# Patient Record
Sex: Female | Born: 1980 | Race: White | Hispanic: No | Marital: Single | Smoking: Never smoker
Health system: Southern US, Community
[De-identification: ages and names within clinical notes are randomized; demographics above are authoritative.]

## PROBLEM LIST (undated history)

## (undated) DIAGNOSIS — F32A Depression, unspecified: Secondary | ICD-10-CM

---

## 2004-10-15 ENCOUNTER — Emergency Department (HOSPITAL_COMMUNITY): Admission: EM | Admit: 2004-10-15 | Discharge: 2004-10-15 | Payer: Self-pay | Admitting: Emergency Medicine

## 2005-06-02 ENCOUNTER — Ambulatory Visit (HOSPITAL_COMMUNITY): Admission: RE | Admit: 2005-06-02 | Discharge: 2005-06-02 | Payer: Self-pay | Admitting: *Deleted

## 2005-07-13 ENCOUNTER — Ambulatory Visit (HOSPITAL_COMMUNITY): Admission: RE | Admit: 2005-07-13 | Discharge: 2005-07-13 | Payer: Self-pay | Admitting: *Deleted

## 2005-12-04 ENCOUNTER — Ambulatory Visit: Payer: Self-pay | Admitting: Obstetrics and Gynecology

## 2005-12-04 ENCOUNTER — Inpatient Hospital Stay (HOSPITAL_COMMUNITY): Admission: AD | Admit: 2005-12-04 | Discharge: 2005-12-06 | Payer: Self-pay | Admitting: Family Medicine

## 2007-05-29 ENCOUNTER — Emergency Department (HOSPITAL_COMMUNITY): Admission: EM | Admit: 2007-05-29 | Discharge: 2007-05-29 | Payer: Self-pay | Admitting: Family Medicine

## 2007-06-21 ENCOUNTER — Ambulatory Visit: Payer: Self-pay | Admitting: Infectious Diseases

## 2007-06-21 DIAGNOSIS — B009 Herpesviral infection, unspecified: Secondary | ICD-10-CM | POA: Insufficient documentation

## 2007-09-16 ENCOUNTER — Emergency Department (HOSPITAL_COMMUNITY): Admission: EM | Admit: 2007-09-16 | Discharge: 2007-09-16 | Payer: Self-pay | Admitting: Family Medicine

## 2008-02-07 ENCOUNTER — Emergency Department (HOSPITAL_COMMUNITY): Admission: EM | Admit: 2008-02-07 | Discharge: 2008-02-07 | Payer: Self-pay

## 2008-06-29 ENCOUNTER — Emergency Department (HOSPITAL_COMMUNITY): Admission: EM | Admit: 2008-06-29 | Discharge: 2008-06-29 | Payer: Self-pay | Admitting: Emergency Medicine

## 2010-01-08 IMAGING — CR DG CERVICAL SPINE COMPLETE 4+V
5 series · 5 of 5 positions shown · non-contrast
Comparison: No prior studies

CLINICAL DATA: Neck pain

CERVICAL SPINE - COMPLETE 4+ VIEW

[w c-spine lat]
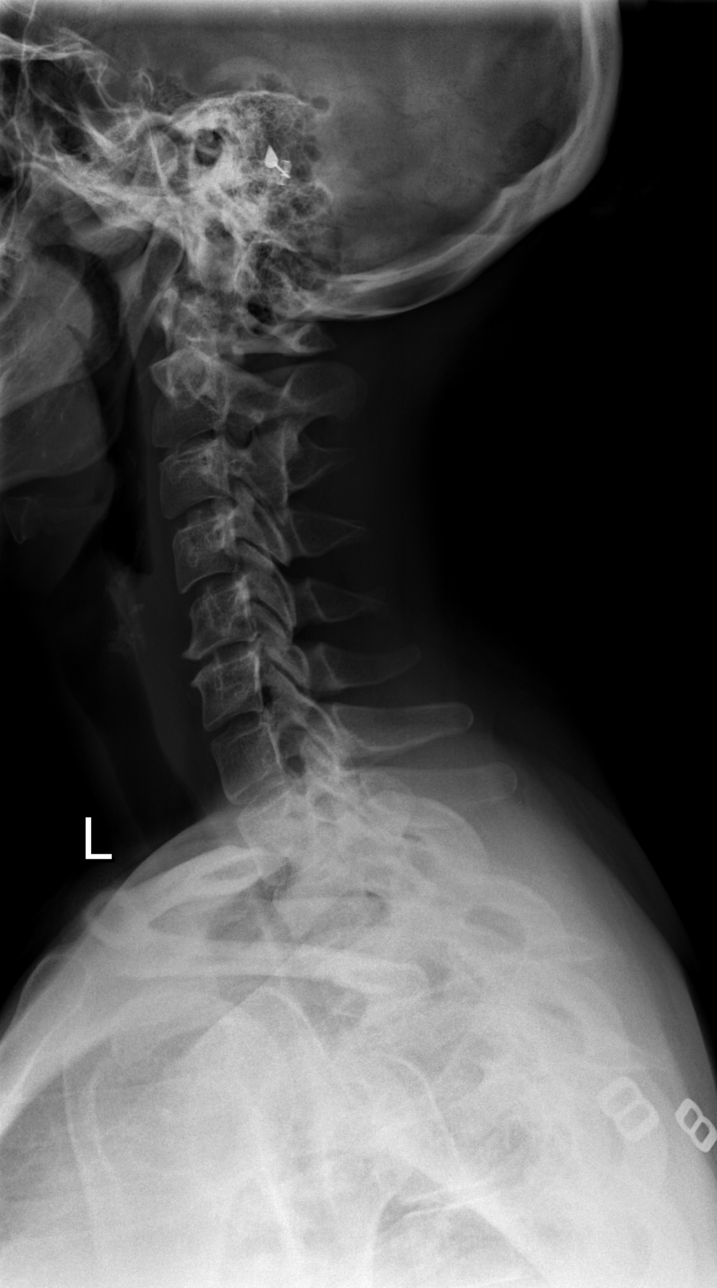

[w c-spine oblique (1 of 2)]
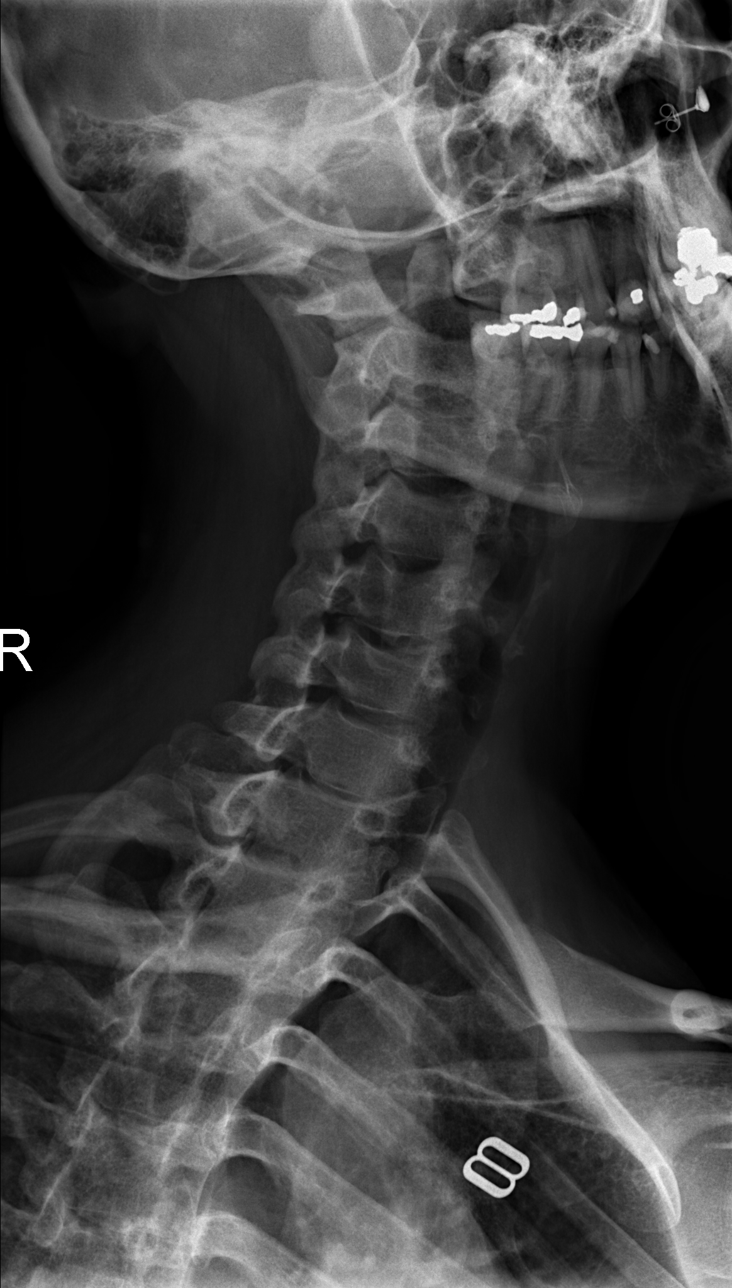

[w c-spine oblique (2 of 2)]
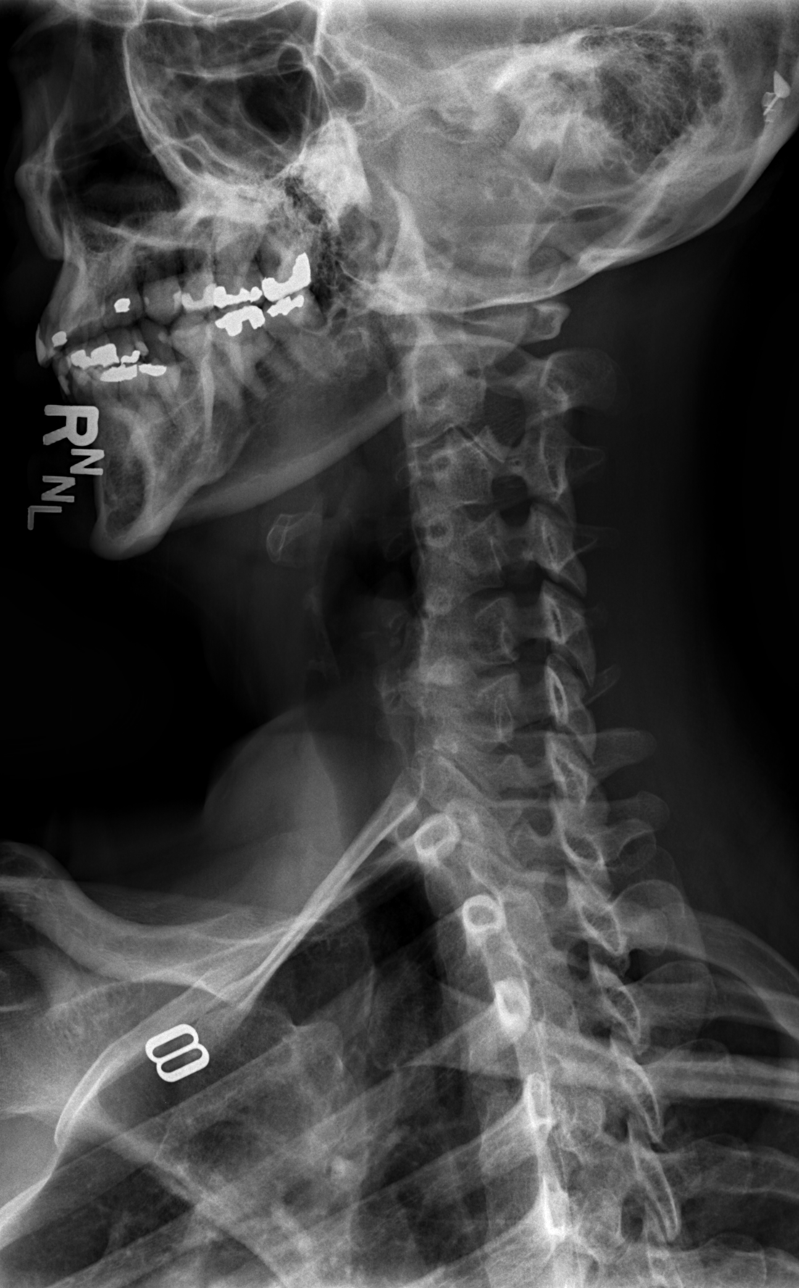

[w c-spine a.p.]
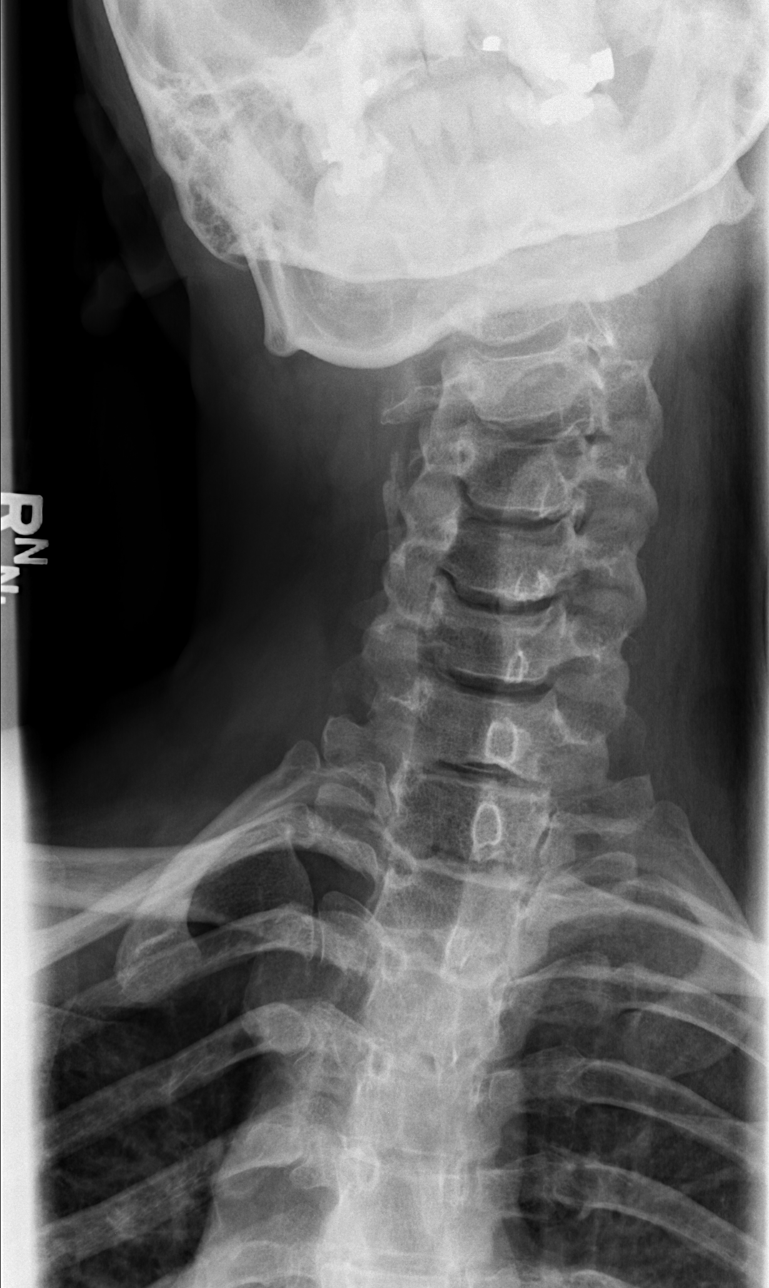

[w c-spine odontoid]
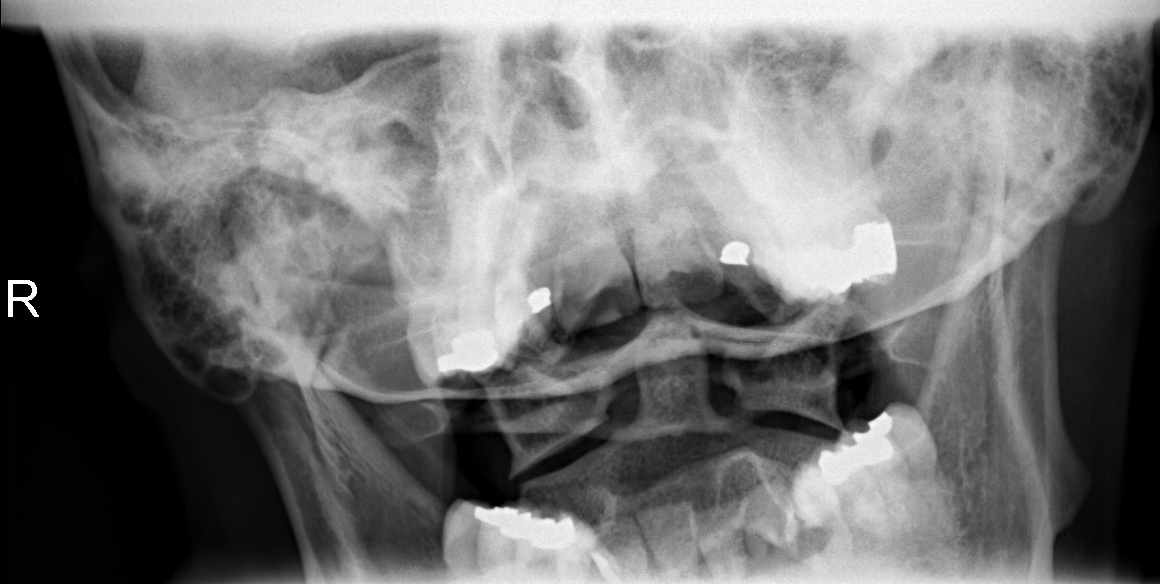

[5 of 5 positions shown; findings below may reference images not displayed]

FINDINGS: C5-C6 degenerative disc space narrowing is present.
There are no fractures, subluxations, or destructive changes.  The
C1-C2 articulation has a normal appearance.
IMPRESSION: C5-C6 degenerative disc space narrowing.  Otherwise negative study.

## 2010-09-28 LAB — COMPREHENSIVE METABOLIC PANEL
ALT: 34 U/L (ref 0–35)
AST: 20 U/L (ref 0–37)
Albumin: 4.2 g/dL (ref 3.5–5.2)
Alkaline Phosphatase: 56 U/L (ref 39–117)
BUN: 10 mg/dL (ref 6–23)
CO2: 26 mEq/L (ref 19–32)
Calcium: 9 mg/dL (ref 8.4–10.5)
Chloride: 104 mEq/L (ref 96–112)
Creatinine, Ser: 0.46 mg/dL (ref 0.4–1.2)
GFR calc Af Amer: >60 mL/min (ref >60)
GFR calc non Af Amer: >60 mL/min (ref >60)
Glucose, Bld: 91 mg/dL (ref 70–99)
Potassium: 3.6 mEq/L (ref 3.5–5.1)
Sodium: 136 mEq/L (ref 135–145)
Total Bilirubin: 0.6 mg/dL (ref 0.3–1.2)
Total Protein: 7.2 g/dL (ref 6.0–8.3)

## 2010-09-28 LAB — CBC
HCT: 37.8 % (ref 36.0–46.0)
Hemoglobin: 12.7 g/dL (ref 12.0–15.0)
MCHC: 33.7 g/dL (ref 30.0–36.0)
MCV: 91.6 fL (ref 78.0–100.0)
Platelets: 241 10*3/uL (ref 150–400)
RBC: 4.13 MIL/uL (ref 3.87–5.11)
RDW: 12.5 % (ref 11.5–15.5)
WBC: 8.1 10*3/uL (ref 4.0–10.5)

## 2010-09-28 LAB — POCT PREGNANCY, URINE: Preg Test, Ur: POSITIVE

## 2010-09-28 LAB — DIFFERENTIAL
Basophils Absolute: 0 10*3/uL (ref 0.0–0.1)
Basophils Relative: 0 % (ref 0–1)
Eosinophils Absolute: 0.2 10*3/uL (ref 0.0–0.7)
Eosinophils Relative: 2 % (ref 0–5)
Lymphocytes Relative: 13 % (ref 12–46)
Lymphs Abs: 1 10*3/uL (ref 0.7–4.0)
Monocytes Absolute: 0.5 10*3/uL (ref 0.1–1.0)
Monocytes Relative: 7 % (ref 3–12)
Neutro Abs: 6.4 10*3/uL (ref 1.7–7.7)
Neutrophils Relative %: 79 % — ABNORMAL HIGH (ref 43–77)

## 2010-09-28 LAB — URINALYSIS, ROUTINE W REFLEX MICROSCOPIC
Nitrite: NEGATIVE
Specific Gravity, Urine: 1.026 (ref 1.005–1.030)
Urobilinogen, UA: 0.2 mg/dL (ref 0.0–1.0)
pH: 6 (ref 5.0–8.0)

## 2014-09-06 ENCOUNTER — Emergency Department (HOSPITAL_COMMUNITY)
Admission: EM | Admit: 2014-09-06 | Discharge: 2014-09-06 | Disposition: A | Discharge status: Home / Self Care | Payer: PRIVATE HEALTH INSURANCE | Source: Home / Self Care | Attending: Family Medicine | Admitting: Family Medicine

## 2014-09-06 ENCOUNTER — Encounter (HOSPITAL_COMMUNITY): Payer: Self-pay

## 2014-09-06 DIAGNOSIS — J301 Allergic rhinitis due to pollen: Secondary | ICD-10-CM

## 2014-09-06 DIAGNOSIS — J029 Acute pharyngitis, unspecified: Secondary | ICD-10-CM

## 2014-09-06 LAB — POCT RAPID STREP A: STREPTOCOCCUS, GROUP A SCREEN (DIRECT): NEGATIVE

## 2014-09-06 NOTE — ED Provider Notes (Signed)
CSN: [id number]     Arrival date & time 09/06/14  1554 History   First MD Initiated Contact with Patient 09/06/14 1653     Chief Complaint  Patient presents with  . Sore Throat   (Consider location/radiation/quality/duration/timing/severity/associated sxs/prior Treatment) HPI Comments: 34 year old female complaining of a sore throat, discomfort in the ears particularly when swallowing and sinus congestion and drainage. She also has a runny nose and PND. Denies fever. Denies fatigue or malaise.   History reviewed. No pertinent past medical history. History reviewed. No pertinent past surgical history. No family history on file. History  Substance Use Topics  . Smoking status: Not on file  . Smokeless tobacco: Not on file  . Alcohol Use: Not on file   OB History    No data available     Review of Systems  Constitutional: Negative for fever, activity change and fatigue.  HENT: Positive for postnasal drip and sore throat. Negative for trouble swallowing.        As per history of present illness  Eyes: Negative.   Respiratory: Negative for cough, shortness of breath and wheezing.   Cardiovascular: Negative for chest pain.  Gastrointestinal: Negative.     Allergies  Review of patient's allergies indicates no known allergies.  Home Medications   Prior to Admission medications   Not on File   BP 104/71 mmHg  Pulse 82  Temp(Src) 97.9 F (36.6 C) (Oral)  Resp 18  SpO2 100% Physical Exam  Constitutional: She is oriented to person, place, and time. She appears well-developed and well-nourished.  HENT:  Mouth/Throat: No oropharyngeal exudate.  Bilateral TMs are transparent, no effusion or erythema. Mildly retracted. Oropharynx with light erythema, cobblestoning and clear PND.  Eyes: Conjunctivae and EOM are normal.  Neck: Normal range of motion. Neck supple.  Cardiovascular: Normal rate and regular rhythm.   Pulmonary/Chest: Effort normal and breath sounds normal. No  respiratory distress. She has no wheezes. She has no rales.  Musculoskeletal: Normal range of motion.  Lymphadenopathy:    She has no cervical adenopathy.  Neurological: She is alert and oriented to person, place, and time.  Skin: Skin is warm.  Nursing note and vitals reviewed.   ED Course  Procedures (including critical care time) Labs Review Labs Reviewed  POCT RAPID STREP A ([facility] CARE ONLY)    Imaging Review No results found.   MDM   1. Allergic rhinitis due to pollen   2. Allergic pharyngitis     Zyrtec daily. If needed, especially at night for excess drainage can get Chlor-Trimeton  Generic chlorphenermine and take half a tablet.  Hayden Rasmussenavid Chrissy Ealey, NP 09/06/14 [phone]

## 2014-09-06 NOTE — Discharge Instructions (Signed)
Sore Throat A sore throat is a painful, burning, sore, or scratchy feeling of the throat. There may be pain or tenderness when swallowing or talking. You may have other symptoms with a sore throat. These include coughing, sneezing, fever, or a swollen neck. A sore throat is often the first sign of another sickness. These sicknesses may include a cold, flu, strep throat, or an infection called mono. Most sore throats go away without medical treatment.  HOME CARE   Only take medicine as told by your doctor.  Drink enough fluids to keep your pee (urine) clear or pale yellow.  Rest as needed.  Try using throat sprays, lozenges, or suck on hard candy (if older than 4 years or as told).  Sip warm liquids, such as broth, herbal tea, or warm water with honey. Try sucking on frozen ice pops or drinking cold liquids.  Rinse the mouth (gargle) with salt water. Mix 1 teaspoon salt with 8 ounces of water.  Do not smoke. Avoid being around others when they are smoking.  Put a humidifier in your bedroom at night to moisten the air. You can also turn on a hot shower and sit in the bathroom for 5-10 minutes. Be sure the bathroom door is closed. GET HELP RIGHT AWAY IF:   You have trouble breathing.  You cannot swallow fluids, soft foods, or your spit (saliva).  You have more puffiness (swelling) in the throat.  Your sore throat does not get better in 7 days.  You feel sick to your stomach (nauseous) and throw up (vomit).  You have a fever or lasting symptoms for more than 2-3 days.  You have a fever and your symptoms suddenly get worse. MAKE SURE YOU:   Understand these instructions.  Will watch your condition.  Will get help right away if you are not doing well or get worse. Document Released: 03/09/2008 Document Revised: 02/23/2012 Document Reviewed: 02/06/2012 ExitCare Patient Information 2015 [employer]. This information is not intended to replace advice given to you by your health  care provider. Make sure you discuss any questions you have with your health care provider.  

## 2014-09-06 NOTE — ED Notes (Signed)
Patient complains of sore throat that started this past Monday Started with a headache and patient tends to think it might be allergy related

## 2014-09-09 LAB — CULTURE, GROUP A STREP: Strep A Culture: NEGATIVE

## 2019-02-17 ENCOUNTER — Encounter: Payer: Self-pay | Admitting: Emergency Medicine

## 2019-02-17 ENCOUNTER — Telehealth: Payer: Self-pay | Admitting: Emergency Medicine

## 2019-02-17 ENCOUNTER — Emergency Department
Admission: EM | Admit: 2019-02-17 | Discharge: 2019-02-17 | Disposition: A | Discharge status: Home / Self Care | Payer: PRIVATE HEALTH INSURANCE | Source: Home / Self Care

## 2019-02-17 ENCOUNTER — Other Ambulatory Visit: Payer: Self-pay

## 2019-02-17 DIAGNOSIS — L089 Local infection of the skin and subcutaneous tissue, unspecified: Secondary | ICD-10-CM

## 2019-02-17 MED ORDER — CLINDAMYCIN HCL 300 MG PO CAPS: 300.0000 mg | ORAL_CAPSULE | Freq: Four times a day (QID) | ORAL | 0 refills | Status: AC

## 2019-02-17 MED ORDER — MUPIROCIN CALCIUM 2 % NA OINT: TOPICAL_OINTMENT | Freq: Two times a day (BID) | NASAL | 0 refills | Status: DC

## 2019-02-17 MED ORDER — FLUCONAZOLE 150 MG PO TABS: 150.0000 mg | ORAL_TABLET | Freq: Every day | ORAL | 1 refills | Status: DC

## 2019-02-17 NOTE — Discharge Instructions (Signed)
°  Please take antibiotics as prescribed and be sure to complete entire course even if you start to feel better to ensure infection does not come back.  Please follow up with your family medicine provider in 4-5 days if not improving, sooner if significantly worsening.  Follow up with dermatologist next month as previously scheduled.

## 2019-02-17 NOTE — ED Provider Notes (Signed)
[facility]    CSN: [id number] Arrival date & time: 02/17/19  1138      HistoryIvar Davila   Chief Complaint Chief Complaint  Patient presents with  . Mass    nose    HPI Theresa Davila is a 38 y.[name].   HPI  Theresa Davila is a 38 y.[name] presenting to UC with c/Davila gradually worsening red tender bump on her Right nostril that started about 1-2 weeks ago. Pain is aching and sore, worse when it is touched. No bleeding or drainage from it. Hx of another similar skin sore on her Right forearm that has been treated with multiple rounds of antibiotics including mupirocin, doxycycline, amoxicillin and augmentin.  Most recent antibiotic tried for current bump on nose was Augmentin about 1 week ago with mild temporary relief.  Denies specific dx of MRSA but she states the skin infection on her Right forearm likely could have benefited from being lanced as she had an infected cyst on her back a few years ago lanced, and it healed much quicker than the infection on her arm.  She has an appointment with dermatology but not until mid October. Denies fever, chills, n/v/d.    History reviewed. No pertinent past medical history.  Patient Active Problem List   Diagnosis Date Noted  . 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  OB History   No obstetric history on file.      Home Medications    Prior to Admission medications   Medication Sig Start Date End Date Taking? Authorizing Provider  norethindrone-ethinyl estradiol (LOESTRIN 1/20, 21,) 1-20 MG-MCG tablet Take 1 tablet by mouth daily.   Yes [provider]  valACYclovir (VALTREX) 1000 MG tablet Take 1,000 mg by mouth daily.   Yes [provider]  clindamycin (CLEOCIN) 300 MG capsule Take 1 capsule (300 mg total) by mouth 4 (four) times daily for 7 days. 02/17/19 02/24/19  Lurene ShadowPhelps, Naquan Garman Davila, Theresa Davila  fluconazole (DIFLUCAN) 150 MG tablet Take 1 tablet (150 mg total) by mouth daily. 02/17/19   Lurene ShadowPhelps, Mayci Haning Davila, Theresa Davila   mupirocin nasal ointment (BACTROBAN) 2 % Place into the nose 2 (two) times daily for 5 days. 02/17/19 02/22/19  Lurene ShadowPhelps, Jerolene Kupfer Davila, Theresa Davila    Family History No family history on file.  Social History Social History   Tobacco Use  . Smoking status: Not on file  Substance Use Topics  . Alcohol use: Not on file  . Drug use: Not on file     Allergies   Patient has no known allergies.   Review of Systems Review of Systems  Constitutional: Negative for chills and fever.  Skin: Positive for color change. Negative for wound.     Physical Exam Triage Vital Signs ED Triage Vitals  Enc Vitals Group     BP 02/17/19 1304 121/76     Pulse Rate 02/17/19 1304 84     Resp 02/17/19 1304 (!) 8     Temp 02/17/19 1304 98.1 F (36.7 C)     Temp Source 02/17/19 1304 Oral     SpO2 02/17/19 1304 98 %     Weight 02/17/19 1306 190 lb (86.2 kg)     Height 02/17/19 1306 5\' 6"  (1.676 m)     Head Circumference --      Peak Flow --      Pain Score 02/17/19 1306 0     Pain Loc --  Pain Edu? --      Excl. in [place]? --    No data found.  Updated Vital Signs BP 121/76 (BP Location: Right Arm)   Pulse 84   Temp 98.1 F (36.7 C) (Oral)   Resp (!) 8   Ht 5\' 6"  (1.676 m)   Wt 190 lb (86.2 kg)   LMP 01/30/2019   SpO2 98%   BMI 30.67 kg/m   Visual Acuity Right Eye Distance:   Left Eye Distance:   Bilateral Distance:    Right Eye Near:   Left Eye Near:    Bilateral Near:     Physical Exam Vitals signs and nursing note reviewed.  Constitutional:      Appearance: Normal appearance. She is well-developed.  HENT:     Head: Normocephalic and atraumatic.     Nose: Nasal tenderness present.     Right Sinus: No maxillary sinus tenderness or frontal sinus tenderness.     Left Sinus: No maxillary sinus tenderness or frontal sinus tenderness.   Neck:     Musculoskeletal: Normal range of motion.  Cardiovascular:     Rate and Rhythm: Normal rate.  Pulmonary:     Effort: Pulmonary effort is  normal.  Musculoskeletal: Normal range of motion.  Skin:    General: Skin is warm and dry.  Neurological:     Mental Status: She is alert and oriented to person, place, and time.  Psychiatric:        Behavior: Behavior normal.      UC Treatments / Results  Labs (all labs ordered are listed, but only abnormal results are displayed) Labs Reviewed - No data to display  EKG   Radiology No results found.  Procedures Procedures (including critical care time)  Medications Ordered in UC Medications - No data to display  Initial Impression / Assessment and Plan / UC Course  I have reviewed the triage vital signs and the nursing notes.  Pertinent labs & imaging results that were available during my care of the patient were reviewed by me and considered in my medical decision making (see chart for details).     Pustule on Right nostril. Given reports of similar skin infections on other part's of pt's body, and most recent failed treatment with Augmentin, will tx for MRSA and start pt on clindamycin and encourage use of mupirocin in both nostrils. F/u with dermatologist as previously scheduled AVS provided  Final Clinical Impressions(s) / UC Diagnoses   Final diagnoses:  Recurrent infection of skin  Skin pustule     Discharge Instructions      Please take antibiotics as prescribed and be sure to complete entire course even if you start to feel better to ensure infection does not come back.  Please follow up with your family medicine provider in 4-5 days if not improving, sooner if significantly worsening.  Follow up with dermatologist next month as previously scheduled.    ED Prescriptions    Medication Sig Dispense Auth. Provider   mupirocin nasal ointment (BACTROBAN) 2 % Place into the nose 2 (two) times daily for 5 days. 1 g Theresa Davila, Theresa Davila, Theresa Davila   clindamycin (CLEOCIN) 300 MG capsule Take 1 capsule (300 mg total) by mouth 4 (four) times daily for 7 days. 28  capsule Theresa Davila, Theresa Davila     Controlled Substance Prescriptions [place] Controlled Substance Registry consulted? Not Applicable   Tyrell Antonio 02/18/19 [id number]

## 2019-02-17 NOTE — ED Triage Notes (Signed)
Patient reports complex history of infected spot on arm last month; now spot on end of nose red and tender. Has been treated with augmentin. Concerned about Staph possibility. No previous diagnosis of MRSA. Has appt with dermatology mid October.

## 2020-06-19 ENCOUNTER — Other Ambulatory Visit: Payer: Self-pay

## 2020-06-19 ENCOUNTER — Emergency Department: Admit: 2020-06-19 | Discharge status: Absent without leave | Payer: Self-pay

## 2020-06-19 ENCOUNTER — Emergency Department
Admission: EM | Admit: 2020-06-19 | Discharge: 2020-06-19 | Disposition: A | Discharge status: Home / Self Care | Payer: PRIVATE HEALTH INSURANCE | Source: Home / Self Care

## 2020-06-19 DIAGNOSIS — J340 Abscess, furuncle and carbuncle of nose: Secondary | ICD-10-CM

## 2020-06-19 DIAGNOSIS — Z8614 Personal history of Methicillin resistant Staphylococcus aureus infection: Secondary | ICD-10-CM

## 2020-06-19 DIAGNOSIS — R6889 Other general symptoms and signs: Secondary | ICD-10-CM | POA: Diagnosis not present

## 2020-06-19 DIAGNOSIS — Z20822 Contact with and (suspected) exposure to covid-19: Secondary | ICD-10-CM

## 2020-06-19 MED ORDER — MUPIROCIN 2 % EX OINT: TOPICAL_OINTMENT | CUTANEOUS | 0 refills | Status: DC

## 2020-06-19 MED ORDER — CLINDAMYCIN HCL 300 MG PO CAPS: 300.0000 mg | ORAL_CAPSULE | Freq: Three times a day (TID) | ORAL | 0 refills | Status: AC

## 2020-06-19 NOTE — ED Triage Notes (Signed)
Pt c/o runny nose, cough, and headache. Says she got a cold after Christmas. Says she had a cyst that burst in her nose and leaking green pus. Non productive cough. Works at [employer] and tested daily, covid neg as of yesterday. Tylenol prn

## 2020-06-19 NOTE — ED Provider Notes (Addendum)
[facility]    CSN: [id number] Arrival date & time: 06/19/20  1111      History   Chief Complaint Chief Complaint  Patient presents with  . Cough  . Sore Throat  . Headache    HPI Theresa Davila is a 40 y.o. female.   HPI  Theresa Davila is a 40 y.o. female presenting to UC with c/o rhinorrhea, cough, congestion and generalized HA.  Mild symptoms after Christmas but worsening URI symptoms the last 3-4 days. States she also noted a sore in her Left nostril with green-yellow drainage since yesterday and tenderness to Left cheek. Cough is nonproductive. She works at a [employer] and tests daily for COVID, all have been negative as of yesterday. She notes a coworker did test positive for COVID last week.  Denies fever, chills, n/v/d. HR is elevated. Denies chest pain, SOB or palpations. States she took adderall last night, wonders if that could be the source of her elevated HR.    History reviewed. No pertinent past medical history.  Patient Active Problem List   Diagnosis Date Noted  . 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 No pertinent surgical history.  OB History   No obstetric history on file.      Home Medications    Prior to Admission medications   Medication Sig Start Date End Date Taking? Authorizing Provider  amphetamine-dextroamphetamine (ADDERALL XR) 15 MG 24 hr capsule Take by mouth. 03/25/20  Yes [provider]  amphetamine-dextroamphetamine (ADDERALL) 5 MG tablet Take 1 tablet by mouth daily. 03/25/20  Yes [provider]  buPROPion (WELLBUTRIN XL) 150 MG 24 hr tablet Take by mouth. 03/20/20  Yes [provider]  clindamycin (CLEOCIN) 300 MG capsule Take 1 capsule (300 mg total) by mouth 3 (three) times daily for 7 days. 06/19/20 06/26/20 Yes Juana Montini, Vangie Bicker, PA-C  mupirocin ointment (BACTROBAN) 2 % Apply to both nostrils 3 times daily for 5 days 06/19/20  Yes Doroteo Glassman, Vangie Bicker, PA-C  fluconazole (DIFLUCAN) 150 MG tablet Take 1 tablet (150 mg  total) by mouth daily. 02/17/19   Lurene Shadow, PA-C  mupirocin nasal ointment (BACTROBAN) 2 % Place into the nose 2 (two) times daily for 5 days. 02/17/19 02/22/19  Lurene Shadow, PA-C  norethindrone-ethinyl estradiol (LOESTRIN 1/20, 21,) 1-20 MG-MCG tablet Take 1 tablet by mouth daily.    [provider]  valACYclovir (VALTREX) 1000 MG tablet Take 1,000 mg by mouth daily.    [provider]    Family History History reviewed. No pertinent family history.  Social History Social History   Tobacco Use  . Smoking status: Never Smoker  . Smokeless tobacco: Never Used  Vaping Use  . Vaping Use: Never used  Substance Use Topics  . Alcohol use: Not Currently     Allergies   Patient has no known allergies.   Review of Systems Review of Systems  Constitutional: Negative for chills and fever.  HENT: Positive for congestion, facial swelling, sinus pressure and sinus pain. Negative for ear pain, sore throat, trouble swallowing and voice change.   Respiratory: Positive for cough. Negative for shortness of breath.   Cardiovascular: Negative for chest pain and palpitations.  Gastrointestinal: Negative for abdominal pain, diarrhea, nausea and vomiting.  Musculoskeletal: Negative for arthralgias, back pain and myalgias.  Skin: Negative for rash.  Neurological: Positive for headaches. Negative for dizziness and light-headedness.  All other systems reviewed and are negative.    Physical Exam Triage  Vital Signs ED Triage Vitals  Enc Vitals Group     BP 06/19/20 1207 (!) 134/93     Pulse Rate 06/19/20 1207 (!) 122     Resp 06/19/20 1207 18     Temp 06/19/20 1207 99.7 F (37.6 C)     Temp Source 06/19/20 1207 Oral     SpO2 06/19/20 1207 99 %     Weight --      Height --      Head Circumference --      Peak Flow --      Pain Score 06/19/20 1208 4     Pain Loc --      Pain Edu? --      Excl. in [place]? --    No data found.  Updated Vital Signs BP (!) 134/93 (BP  Location: Right Arm)   Pulse (!) 114   Temp 99.7 F (37.6 C) (Oral)   Resp 18   SpO2 98%   Visual Acuity Right Eye Distance:   Left Eye Distance:   Bilateral Distance:    Right Eye Near:   Left Eye Near:    Bilateral Near:     Physical Exam Vitals and nursing note reviewed.  Constitutional:      General: She is not in acute distress.    Appearance: She is well-developed and well-nourished. She is not ill-appearing, toxic-appearing or diaphoretic.  HENT:     Head: Normocephalic and atraumatic.     Right Ear: Tympanic membrane and ear canal normal.     Left Ear: Tympanic membrane and ear canal normal.     Nose: Nasal tenderness and congestion present. No rhinorrhea.     Right Nostril: No foreign body, epistaxis, septal hematoma or occlusion.     Left Nostril: No foreign body, epistaxis, septal hematoma or occlusion.     Right Sinus: No maxillary sinus tenderness or frontal sinus tenderness.     Left Sinus: Maxillary sinus tenderness ( mild) present. No frontal sinus tenderness.      Mouth/Throat:     Lips: Pink.     Mouth: Mucous membranes are moist.     Pharynx: Oropharynx is clear. Uvula midline. No pharyngeal swelling, oropharyngeal exudate, posterior oropharyngeal erythema or uvula swelling.  Eyes:     Extraocular Movements: EOM normal.  Cardiovascular:     Rate and Rhythm: Regular rhythm. Tachycardia present.  Pulmonary:     Effort: Pulmonary effort is normal. No respiratory distress.     Breath sounds: Normal breath sounds. No stridor. No wheezing, rhonchi or rales.  Musculoskeletal:        General: Normal range of motion.     Cervical back: Normal range of motion and neck supple.  Lymphadenopathy:     Cervical: No cervical adenopathy.  Skin:    General: Skin is warm and dry.  Neurological:     Mental Status: She is alert and oriented to person, place, and time.  Psychiatric:        Mood and Affect: Mood and affect normal.        Behavior: Behavior normal.       UC Treatments / Results  Labs (all labs ordered are listed, but only abnormal results are displayed) Labs Reviewed  COVID-19, FLU A+B AND RSV    EKG   Radiology No results found.  Procedures Procedures (including critical care time)  Medications Ordered in UC Medications - No data to display  Initial Impression / Assessment and Plan / UC Course  I have reviewed the triage vital signs and the nursing notes.  Pertinent labs & imaging results that were available during my care of the patient were reviewed by me and considered in my medical decision making (see chart for details).     Mild tachycardia, O2 Sat 98% on RA. Pt denies chest pain, SOB or palpitations at this time. Doubt PE or ACS.  Hx and exam c/w left nasal abscess, concern for worsening into left side of face Hx of MRSA, will start on clindamycin and mupirocin. covid exposure in high risk working environment, COVID/Flu/RSVS test pending AVS and work note provided   Final Clinical Impressions(s) / UC Diagnoses   Final diagnoses:  Close exposure to COVID-19 virus  Flu-like symptoms  Nasal abscess  History of MRSA infection     Discharge Instructions      Please take antibiotics as prescribed and be sure to complete entire course even if you start to feel better to ensure infection does not come back.  Continue to use the warm compresses and mupirocin on your nose.  You may take 500mg  acetaminophen every 4-6 hours or in combination with ibuprofen 400-600mg  every 6-8 hours as needed for pain, inflammation, and fever.  Be sure to well hydrated with clear liquids and get at least 8 hours of sleep at night, preferably more while sick.   Please follow up with family medicine in 1 week if needed.     ED Prescriptions    Medication Sig Dispense Auth. Provider   clindamycin (CLEOCIN) 300 MG capsule Take 1 capsule (300 mg total) by mouth 3 (three) times daily for 7 days. 21 capsule O,  PA-C   mupirocin ointment (BACTROBAN) 2 % Apply to both nostrils 3 times daily for 5 days 22 g Waylan Rocher, Lurene Shadow     PDMP not reviewed this encounter.   [place], PA-C 06/19/20 1453    08/17/20, PA-C 06/19/20 1454

## 2020-06-19 NOTE — Discharge Instructions (Signed)
  Please take antibiotics as prescribed and be sure to complete entire course even if you start to feel better to ensure infection does not come back.  Continue to use the warm compresses and mupirocin on your nose.  You may take 500mg  acetaminophen every 4-6 hours or in combination with ibuprofen 400-600mg  every 6-8 hours as needed for pain, inflammation, and fever.  Be sure to well hydrated with clear liquids and get at least 8 hours of sleep at night, preferably more while sick.   Please follow up with family medicine in 1 week if needed.

## 2020-06-23 LAB — COVID-19, FLU A+B AND RSV
Influenza A, NAA: NOT DETECTED
Influenza B, NAA: NOT DETECTED
RSV, NAA: NOT DETECTED
SARS-CoV-2, NAA: DETECTED — AB

## 2020-06-24 ENCOUNTER — Telehealth: Payer: Self-pay | Admitting: Emergency Medicine

## 2020-06-24 MED ORDER — FLUCONAZOLE 150 MG PO TABS: ORAL_TABLET | ORAL | 0 refills | Status: DC

## 2020-06-24 NOTE — Telephone Encounter (Signed)
Call back to patient regarding return to work note -placed in chart. Diflucan sent via escript to [facility] on [address] - confirmed w/ pt.

## 2022-01-12 ENCOUNTER — Emergency Department
Admission: RE | Admit: 2022-01-12 | Discharge: 2022-01-12 | Disposition: A | Discharge status: Home / Self Care | Payer: No Typology Code available for payment source | Source: Ambulatory Visit | Attending: Family Medicine | Admitting: Family Medicine

## 2022-01-12 VITALS — BP 134/92 | HR 94 | Temp 99.2°F | Resp 18 | Ht 66.0 in | Wt 204.0 lb

## 2022-01-12 DIAGNOSIS — R591 Generalized enlarged lymph nodes: Secondary | ICD-10-CM

## 2022-01-12 LAB — POCT URINALYSIS DIP (MANUAL ENTRY)
Bilirubin, UA: NEGATIVE
Blood, UA: NEGATIVE
Glucose, UA: NEGATIVE mg/dL
Ketones, POC UA: NEGATIVE mg/dL
Leukocytes, UA: NEGATIVE
Nitrite, UA: NEGATIVE
Protein Ur, POC: NEGATIVE mg/dL
Spec Grav, UA: 1.02 (ref 1.010–1.025)
Urobilinogen, UA: 0.2 E.U./dL
pH, UA: 6.5 (ref 5.0–8.0)

## 2022-01-12 NOTE — ED Provider Notes (Signed)
[facility]    CSN: [id number] Arrival date & time: 01/12/22  1604      History   Chief Complaint Chief Complaint  Patient presents with   Abdominal Pain    Lower right pelvis, hard mass but slowly going away. Six days. Occurred last month as well. - Entered by patient   Appointment    HPI Theresa Davila is a 41 y.o. female.   HPI  Patient has a nodule in the right lower groin area.  Its been coming and going for over a month.  She does not have any urinary symptoms.  No vaginal discharge.  No abdominal pain.  She does shave her pubic area.  Has not noticed any rash.  She also has a lump in her left neck.  She states its been there "a long time.  Denies fatigue.  Fevers.  Rashes. Patient states she recently stopped all her medications.  This includes Wellbutrin and Effexor.  Unknown reason.  History reviewed. No pertinent past medical history.  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 No pertinent surgical history.  OB History   No obstetric history on file.      Home Medications    Prior to Admission medications   Medication Sig Start Date End Date Taking? Authorizing Provider  valACYclovir (VALTREX) 1000 MG tablet Take 1,000 mg by mouth daily.   Yes [provider]    Family History History reviewed. No pertinent family history.  Social History Social History   Tobacco Use   Smoking status: Never   Smokeless tobacco: Never  Vaping Use   Vaping Use: Never used  Substance Use Topics   Alcohol use: Not Currently     Allergies   Patient has no known allergies.   Review of Systems Review of Systems See HPI  Physical Exam Triage Vital Signs ED Triage Vitals  Enc Vitals Group     BP 01/12/22 1622 (!) 134/92     Pulse Rate 01/12/22 1622 94     Resp 01/12/22 1622 18     Temp 01/12/22 1622 99.2 F (37.3 C)     Temp Source 01/12/22 1622 Oral     SpO2 01/12/22 1622 99 %     Weight  01/12/22 1625 204 lb (92.5 kg)     Height 01/12/22 1625 5\' 6"  (1.676 m)     Head Circumference --      Peak Flow --      Pain Score 01/12/22 1625 1     Pain Loc --      Pain Edu? --      Excl. in GC? --    No data found.  Updated Vital Signs BP (!) 134/92 (BP Location: Right Arm)   Pulse 94   Temp 99.2 F (37.3 C) (Oral)   Resp 18   Ht 5\' 6"  (1.676 m)   Wt 92.5 kg   SpO2 99%   BMI 32.93 kg/m      Physical Exam Constitutional:      General: She is not in acute distress.    Appearance: She is well-developed.     Comments: Overweight  HENT:     Head: Normocephalic and atraumatic.     Mouth/Throat:     Comments: Small mobile nontender AC node at the left angle of jaw Eyes:     Conjunctiva/sclera: Conjunctivae normal.     Pupils: Pupils are equal, round, and  reactive to light.  Cardiovascular:     Rate and Rhythm: Normal rate and regular rhythm.     Heart sounds: Normal heart sounds.  Pulmonary:     Effort: Pulmonary effort is normal. No respiratory distress.     Breath sounds: Normal breath sounds.  Abdominal:     General: There is no distension.     Palpations: Abdomen is soft.     Comments: Small nodule noted in the right groin consistent with an inguinal lymph node.  Musculoskeletal:        General: Normal range of motion.     Cervical back: Normal range of motion.  Skin:    General: Skin is warm and dry.  Neurological:     Mental Status: She is alert.      UC Treatments / Results  Labs (all labs ordered are listed, but only abnormal results are displayed) Labs Reviewed  CBC WITH DIFFERENTIAL/PLATELET  POCT URINALYSIS DIP (MANUAL ENTRY)    EKG   Radiology No results found.  Procedures Procedures (including critical care time)  Medications Ordered in UC Medications - No data to display  Initial Impression / Assessment and Plan / UC Course  I have reviewed the triage vital signs and the nursing notes.  Pertinent labs & imaging results that  were available during my care of the patient were reviewed by me and considered in my medical decision making (see chart for details).    Encourage p patient to see her primary care doctor in follow-up.  We will do CBC and UA to rule out serious causes of lymphadenopathy Final Clinical Impressions(s) / UC Diagnoses   Final diagnoses:  Lymphadenopathy     Discharge Instructions      I am getting a CBC and urinalysis to check for any signs of infection The results will be available to you tomorrow on MyChart The swollen lump she feels are small lymph glands.  I do not suspect anything dangerous Follow-up with your primary care doctor   ED Prescriptions   None    PDMP not reviewed this encounter.   Eustace Moore, MD 01/12/22 [phone]

## 2022-01-12 NOTE — ED Triage Notes (Signed)
Patient c/o RLQ "nodule" x 6 days, some nausea and vomiting.  Denies any urinary sx's, not sexually active, denies and diarrhea, afebrile.  Patient has taken Tylenol and Ibuprofen.

## 2022-01-12 NOTE — Discharge Instructions (Signed)
I am getting a CBC and urinalysis to check for any signs of infection The results will be available to you tomorrow on MyChart The swollen lump she feels are small lymph glands.  I do not suspect anything dangerous Follow-up with your primary care doctor

## 2022-01-13 LAB — CBC WITH DIFFERENTIAL/PLATELET
Absolute Monocytes: 546 cells/uL (ref 200–950)
Basophils Absolute: 17 cells/uL (ref 0–200)
Basophils Relative: 0.2 %
Eosinophils Absolute: 67 cells/uL (ref 15–500)
Eosinophils Relative: 0.8 %
HCT: 37.5 % (ref 35.0–45.0)
Hemoglobin: 12.8 g/dL (ref 11.7–15.5)
Lymphs Abs: 2201 cells/uL (ref 850–3900)
MCH: 30.3 pg (ref 27.0–33.0)
MCHC: 34.1 g/dL (ref 32.0–36.0)
MCV: 88.9 fL (ref 80.0–100.0)
MPV: 9.5 fL (ref 7.5–12.5)
Monocytes Relative: 6.5 %
Neutro Abs: 5569 cells/uL (ref 1500–7800)
Neutrophils Relative %: 66.3 %
Platelets: 391 10*3/uL (ref 140–400)
RBC: 4.22 10*6/uL (ref 3.80–5.10)
RDW: 13 % (ref 11.0–15.0)
Total Lymphocyte: 26.2 %
WBC: 8.4 10*3/uL (ref 3.8–10.8)

## 2022-05-14 DIAGNOSIS — Z419 Encounter for procedure for purposes other than remedying health state, unspecified: Secondary | ICD-10-CM | POA: Diagnosis not present

## 2022-06-14 DIAGNOSIS — Z419 Encounter for procedure for purposes other than remedying health state, unspecified: Secondary | ICD-10-CM | POA: Diagnosis not present

## 2022-07-15 DIAGNOSIS — Z419 Encounter for procedure for purposes other than remedying health state, unspecified: Secondary | ICD-10-CM | POA: Diagnosis not present

## 2022-08-13 DIAGNOSIS — Z419 Encounter for procedure for purposes other than remedying health state, unspecified: Secondary | ICD-10-CM | POA: Diagnosis not present

## 2022-09-13 DIAGNOSIS — Z419 Encounter for procedure for purposes other than remedying health state, unspecified: Secondary | ICD-10-CM | POA: Diagnosis not present

## 2022-09-16 ENCOUNTER — Ambulatory Visit: Payer: No Typology Code available for payment source | Admitting: Family Medicine

## 2022-09-16 DIAGNOSIS — R87619 Unspecified abnormal cytological findings in specimens from cervix uteri: Secondary | ICD-10-CM | POA: Insufficient documentation

## 2022-10-13 DIAGNOSIS — Z419 Encounter for procedure for purposes other than remedying health state, unspecified: Secondary | ICD-10-CM | POA: Diagnosis not present

## 2022-11-13 DIAGNOSIS — Z419 Encounter for procedure for purposes other than remedying health state, unspecified: Secondary | ICD-10-CM | POA: Diagnosis not present

## 2024-04-02 ENCOUNTER — Ambulatory Visit
Admission: RE | Admit: 2024-04-02 | Discharge: 2024-04-02 | Disposition: A | Discharge status: Home / Self Care | Payer: Self-pay | Attending: Family Medicine | Admitting: Family Medicine

## 2024-04-02 VITALS — BP 129/73 | HR 78 | Temp 98.2°F | Resp 16

## 2024-04-02 DIAGNOSIS — J069 Acute upper respiratory infection, unspecified: Secondary | ICD-10-CM

## 2024-04-02 HISTORY — DX: Depression, unspecified: F32.A

## 2024-04-02 MED ORDER — DOXYCYCLINE HYCLATE 100 MG PO CAPS: 100.0000 mg | ORAL_CAPSULE | Freq: Two times a day (BID) | ORAL | 0 refills | Status: AC

## 2024-04-02 MED ORDER — AZITHROMYCIN 250 MG PO TABS: ORAL_TABLET | ORAL | 0 refills | Status: DC

## 2024-04-02 NOTE — ED Provider Notes (Signed)
 Theresa Davila    CSN: [id number] Arrival date & time: 04/02/24  1026      History   Chief Complaint Chief Complaint  Patient presents with   Cough    HPI Theresa Davila is a 43 y.o. female.   HPI  Patient states she has been sick for just over a week.  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  She has runny nose coughing and Chest congestion, fever and chills, terrible coughing spells, producing from her sinuses and chest copious green mucus.  Is not getting better with over-the-counter medications.  She did do COVID testing and it was negative.  She has some fatigue and bodyaches.  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      Home Medications    Prior to Admission medications   Medication Sig Start Date End Date Taking? Authorizing Provider  amphetamine-dextroamphetamine (ADDERALL XR) 20 MG 24 hr capsule Take 20 mg by mouth daily.   Yes [provider]  azelastine (ASTELIN) 0.1 % nasal spray Place into both nostrils 2 (two) times daily. Use in each nostril as directed   Yes [provider]  buPROPion (ZYBAN) 150 MG 12 hr tablet Take 150 mg by mouth 2 (two) times daily.   Yes [provider]  doxycycline (VIBRAMYCIN) 100 MG capsule Take 1 capsule (100 mg total) by mouth 2 (two) times daily. 04/02/24  Yes Maranda Jamee Jacob, MD  valACYclovir (VALTREX) 1000 MG tablet Take 1,000 mg by mouth daily.    [provider]    Family History History reviewed. No pertinent family history.  Social History Social History   Tobacco Use   Smoking status: Former    Types: Cigarettes   Smokeless tobacco: Never  Vaping Use    Vaping status: Never Used  Substance Use Topics   Alcohol use: Not Currently   Drug use: Never     Allergies   Patient has no known allergies.   Review of Systems Review of Systems See HPI  Physical Exam Triage Vital Signs ED Triage Vitals  Encounter Vitals Group     BP 04/02/24 1038 129/73     Girls Systolic BP Percentile --      Girls Diastolic BP Percentile --      Boys Systolic BP Percentile --      Boys Diastolic BP Percentile --      Pulse Rate 04/02/24 1038 78     Resp 04/02/24 1038 16     Temp 04/02/24 1038 98.2 F (36.8 C)     Temp src --      SpO2 04/02/24 1038 98 %     Weight --      Height --      Head Circumference --      Peak Flow --      Pain Score 04/02/24 1042 2     Pain Loc --      Pain Education --      Exclude from Growth Chart --    No data found.  Updated Vital Signs BP 129/73   Pulse 78  Temp 98.2 F (36.8 C)   Resp 16   LMP 04/01/2024 (Exact Date)   SpO2 98%     Physical Exam Constitutional:      General: She is not in acute distress.    Appearance: She is well-developed.  HENT:     Head: Normocephalic and atraumatic.     Nose: Congestion present.     Mouth/Throat:     Pharynx: Posterior oropharyngeal erythema present.  Eyes:     Conjunctiva/sclera: Conjunctivae normal.     Pupils: Pupils are equal, round, and reactive to light.  Cardiovascular:     Rate and Rhythm: Normal rate.  Pulmonary:     Effort: Pulmonary effort is normal. No respiratory distress.     Breath sounds: Wheezing present.     Comments: Few scattered wheeze Musculoskeletal:        General: Normal range of motion.     Cervical back: Normal range of motion.  Skin:    General: Skin is warm and dry.  Neurological:     Mental Status: She is alert.      UC Treatments / Results  Labs (all labs ordered are listed, but only abnormal results are displayed) Labs Reviewed - No data to display  EKG   Radiology No results  found.  Procedures Procedures (including critical Davila time)  Medications Ordered in UC Medications - No data to display  Initial Impression / Assessment and Plan / UC Course  I have reviewed the triage vital signs and the nursing notes.  Pertinent labs & imaging results that were available during my Davila of the patient were reviewed by me and considered in my medical decision making (see chart for details).    Patient's had symptoms for just over a week and there is no sign of it improving.  She expresses concern for bronchitis.  Is using over-the-counter cough and cold medicine successfully.  Needs a note for work  Final Clinical Impressions(s) / UC Diagnoses   Final diagnoses:  Acute upper respiratory infection     Discharge Instructions      Take the  Doxycycline antibiotic 2 times a day with food Try to increase your fluids May take over-the-counter cough and cold medicines See your doctor if not improving by the end of the week    ED Prescriptions     Medication Sig Dispense Auth. Provider   doxycycline (VIBRAMYCIN) 100 MG capsule Take 1 capsule (100 mg total) by mouth 2 (two) times daily. 14 capsule Maranda Jamee Jacob, MD      PDMP not reviewed this encounter.   Maranda Jamee Jacob, MD 04/02/24 [phone]

## 2024-04-02 NOTE — Discharge Instructions (Addendum)
 Take the  Doxycycline antibiotic 2 times a day with food Try to increase your fluids May take over-the-counter cough and cold medicines See your doctor if not improving by the end of the week

## 2024-04-02 NOTE — ED Triage Notes (Addendum)
 Coughing up thick green mucus x 1 week, barking cough, body aches. Initially had sore throat. Has been taking alkaseltzer cold and flu, mucinex, tylenol, flonase, azestaline, robitussin.

## 2024-04-03 ENCOUNTER — Telehealth: Payer: Self-pay | Admitting: Emergency Medicine

## 2024-04-03 MED ORDER — FLUCONAZOLE 150 MG PO TABS: 150.0000 mg | ORAL_TABLET | Freq: Every day | ORAL | 0 refills | Status: AC

## 2024-04-03 NOTE — Telephone Encounter (Signed)
 See note.  Patient requests diflucan .  Sent to pharmacy

## 2024-04-03 NOTE — Telephone Encounter (Signed)
 Spoke with patient, states she has started the antibiotic as prescribed.  Patient is requesting a Diflucan  to be sent into pharmacy.  Please advise.

## 2024-04-03 NOTE — Telephone Encounter (Signed)
 Patient informed.
